# Patient Record
Sex: Male | Born: 1955 | Race: Black or African American | Hispanic: No | Marital: Single | Smoking: Never smoker
Health system: Southern US, Community
[De-identification: ages and names within clinical notes are randomized; demographics above are authoritative.]

## PROBLEM LIST (undated history)

## (undated) DIAGNOSIS — E119 Type 2 diabetes mellitus without complications: Secondary | ICD-10-CM

## (undated) DIAGNOSIS — T7840XA Allergy, unspecified, initial encounter: Secondary | ICD-10-CM

## (undated) DIAGNOSIS — I10 Essential (primary) hypertension: Secondary | ICD-10-CM

## (undated) HISTORY — DX: Type 2 diabetes mellitus without complications: E11.9

## (undated) HISTORY — DX: Essential (primary) hypertension: I10

## (undated) HISTORY — DX: Allergy, unspecified, initial encounter: T78.40XA

---

## 2013-12-07 ENCOUNTER — Encounter: Payer: BC Managed Care – PPO | Attending: Family Medicine

## 2013-12-07 DIAGNOSIS — E119 Type 2 diabetes mellitus without complications: Secondary | ICD-10-CM | POA: Insufficient documentation

## 2013-12-07 DIAGNOSIS — Z713 Dietary counseling and surveillance: Secondary | ICD-10-CM | POA: Insufficient documentation

## 2013-12-14 DIAGNOSIS — E119 Type 2 diabetes mellitus without complications: Secondary | ICD-10-CM

## 2013-12-14 DIAGNOSIS — Z713 Dietary counseling and surveillance: Secondary | ICD-10-CM | POA: Diagnosis not present

## 2013-12-20 NOTE — Progress Notes (Signed)

## 2013-12-21 DIAGNOSIS — E119 Type 2 diabetes mellitus without complications: Secondary | ICD-10-CM

## 2013-12-21 NOTE — Progress Notes (Signed)
Patient was seen on 12/21/13 for the third of a series of three diabetes self-management courses at the [facility]. The following learning objectives were met by the patient during this class:    State the amount of activity recommended for healthy living   Describe activities suitable for individual needs   Identify ways to regularly incorporate activity into daily life   Identify barriers to activity and ways to over come these barriers  Identify diabetes medications being personally used and their primary action for lowering glucose and possible side effects   Describe role of stress on blood glucose and develop strategies to address psychosocial issues   Identify diabetes complications and ways to prevent them  Explain how to manage diabetes during illness   Evaluate success in meeting personal goal   Establish 2-3 goals that they will plan to diligently work on until they return for the  66-monthfollow-up visit  Goals:  Follow Diabetes Meal Plan as instructed  Aim for 15-30 mins of physical activity daily as tolerated  Bring food record and glucose log to your follow up visit  Your patient has established the following 4 month goals in their individualized success plan: I will count my carb choices at most meals and snacks I will increase my activity level at least 2 days a week  Your patient has identified these potential barriers to change:  Weather  Your patient has identified their diabetes self-care support plan as  [facility]  Family member  Plan:  Attend [facility] 4 in 4 months

## 2014-04-25 ENCOUNTER — Ambulatory Visit: Payer: BC Managed Care – PPO

## 2015-02-14 ENCOUNTER — Ambulatory Visit (INDEPENDENT_AMBULATORY_CARE_PROVIDER_SITE_OTHER): Payer: BLUE CROSS/BLUE SHIELD

## 2015-02-14 ENCOUNTER — Ambulatory Visit (INDEPENDENT_AMBULATORY_CARE_PROVIDER_SITE_OTHER): Payer: BLUE CROSS/BLUE SHIELD | Admitting: Family Medicine

## 2015-02-14 VITALS — BP 128/76 | HR 64 | Temp 98.5°F | Resp 16 | Ht 67.0 in | Wt 179.6 lb

## 2015-02-14 DIAGNOSIS — M25551 Pain in right hip: Secondary | ICD-10-CM

## 2015-02-14 DIAGNOSIS — M5441 Lumbago with sciatica, right side: Secondary | ICD-10-CM | POA: Diagnosis not present

## 2015-02-14 DIAGNOSIS — R739 Hyperglycemia, unspecified: Secondary | ICD-10-CM

## 2015-02-14 IMAGING — CR DG LUMBAR SPINE COMPLETE 4+V
5 series · 5 of 5 positions shown · non-contrast
Comparison: None.

CLINICAL DATA: Intermittent low back pain for the past 10 days. No
known injury.

EXAM:
LUMBAR SPINE - COMPLETE 4+ VIEW

[AP]
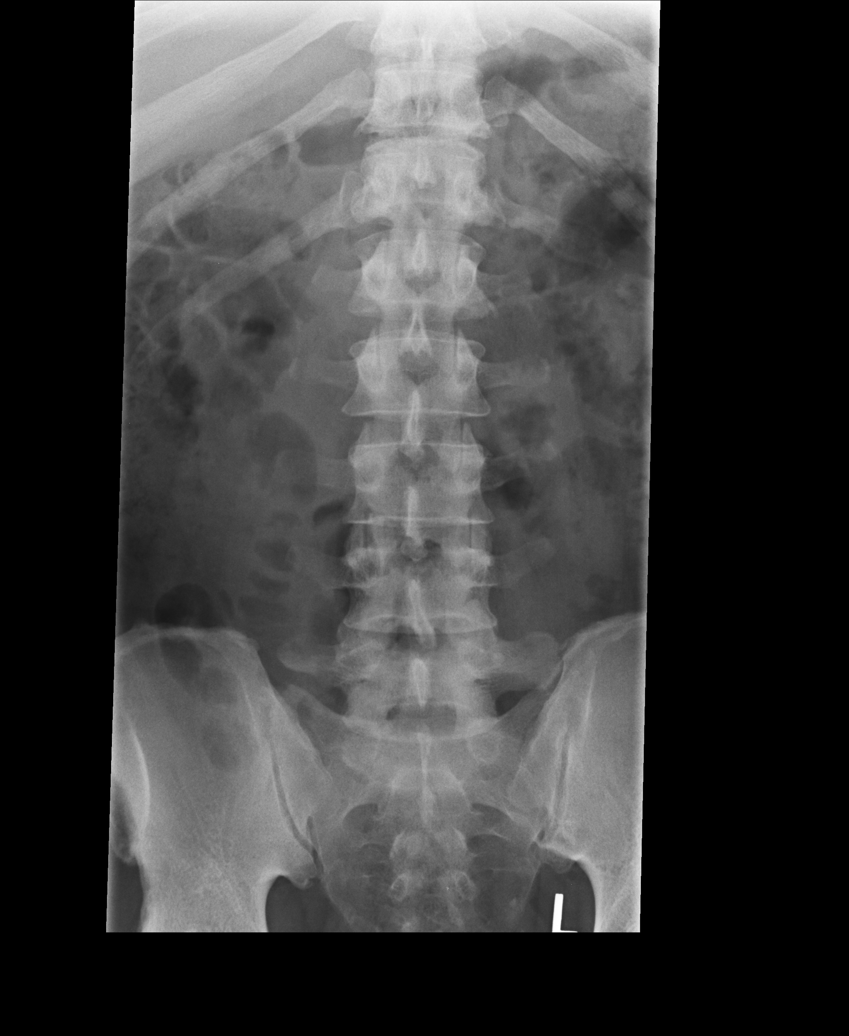

[rpo]
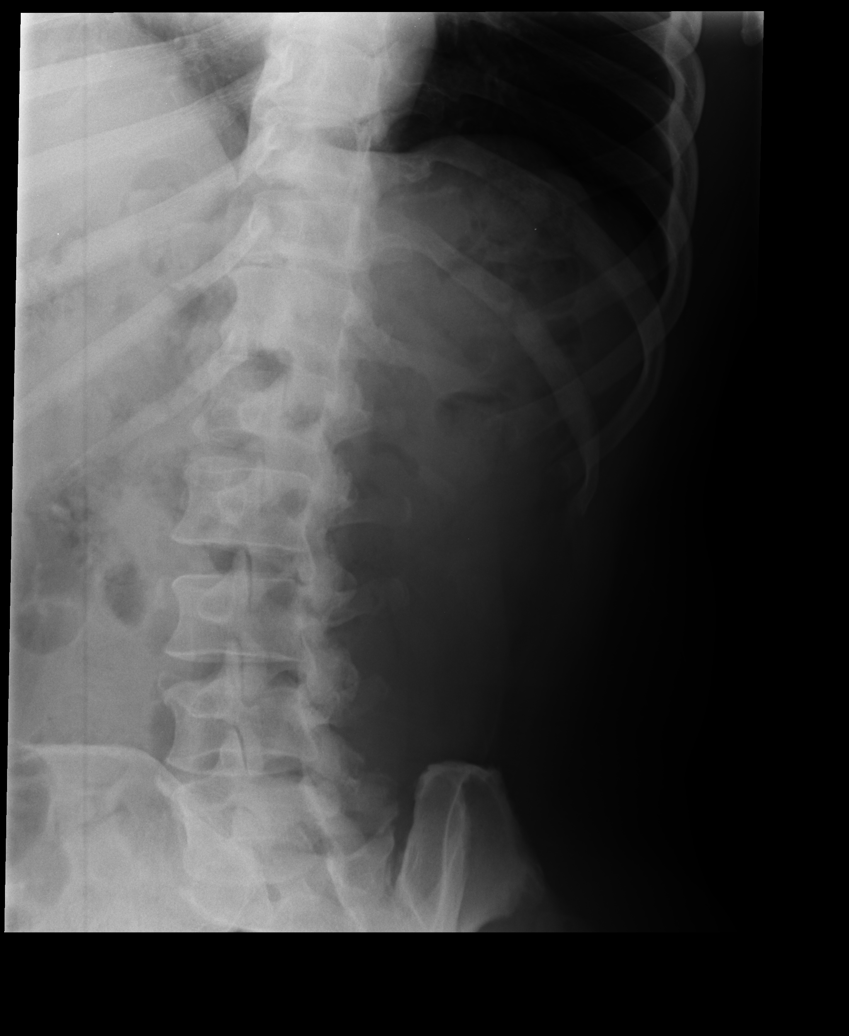

[lpo]
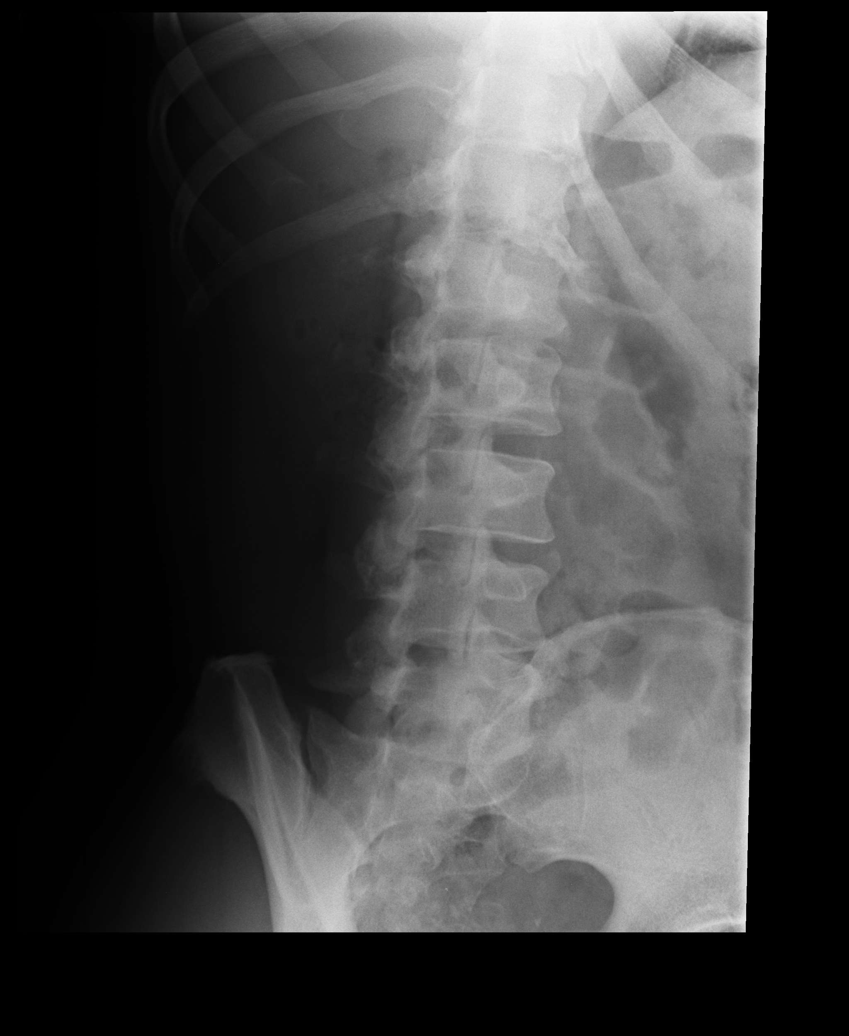

[lateral]
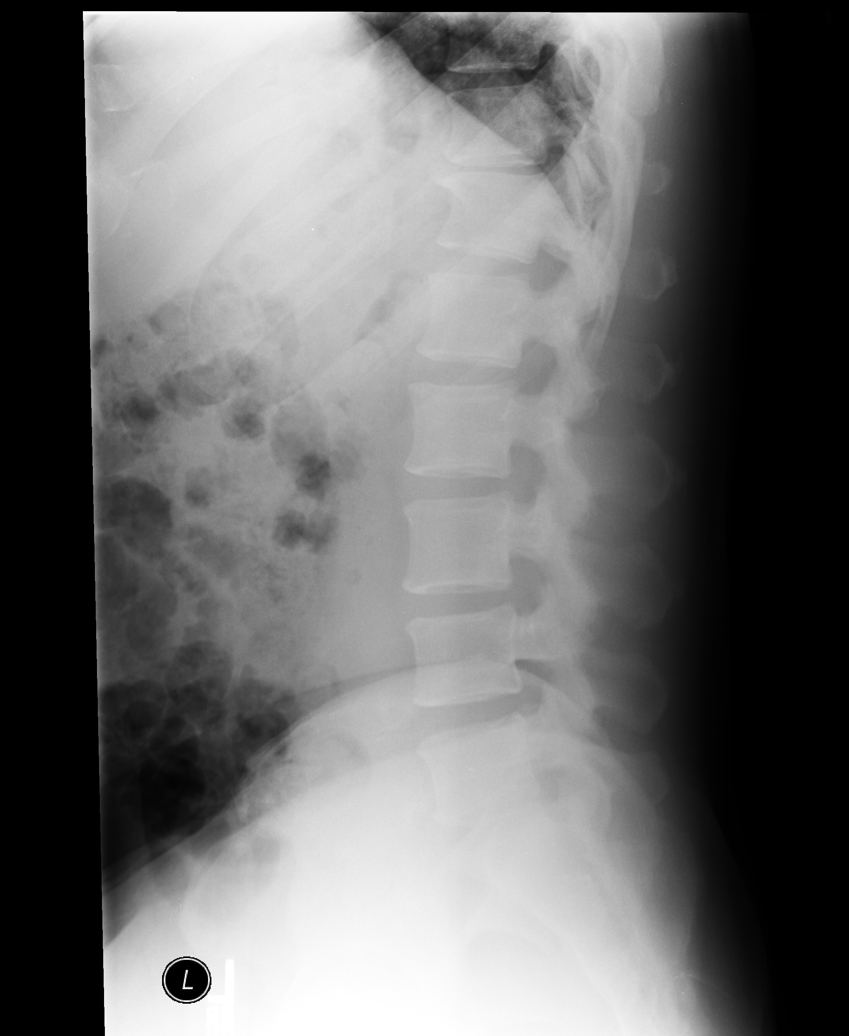

[l5 s1]
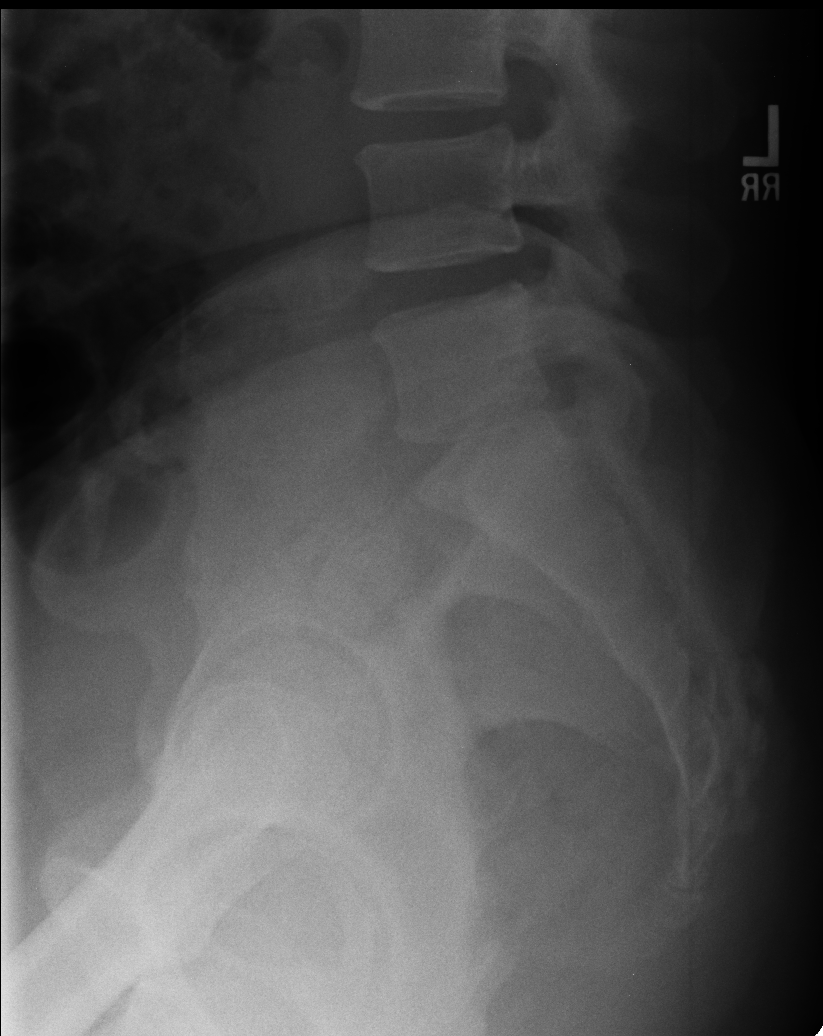

[5 of 5 positions shown; findings below may reference images not displayed]

FINDINGS: Five non-rib-bearing lumbar vertebrae. Mild anterior spur formation
in the lower thoracic spine. Minimal anterior spur formation in the
mid and lower lumbar spine. Mild lateral spur formation in the lower
thoracic and upper lumbar spine. No fractures, pars defects or
subluxations.
IMPRESSION: Mild degenerative changes.

## 2015-02-14 MED ORDER — MELOXICAM 7.5 MG PO TABS: 7.5000 mg | ORAL_TABLET | Freq: Every day | ORAL | Status: AC

## 2015-02-14 MED ORDER — CYCLOBENZAPRINE HCL 5 MG PO TABS: ORAL_TABLET | ORAL | Status: AC

## 2015-02-14 NOTE — Progress Notes (Signed)
Subjective:    Patient ID: Shane Herman, male    DOB: [birth year], 57 y.o.   MRN: [id number]  HPI Shane Herman is a 59 y.o. male  Presents complaining of right hip, leg, and knee pain.  Started about 10 days ago in R hip.  Noticed as sitting in front of TV on the floor. NKI. Front/inside of hip. No prior similar hip issues. Treated with ibuprofen otc tid, heating pad. Notices with bending forward or walking, and with walking has pain into knee past 4 days.  Feels like knee is going to collapse or less control at times. Worse after sitting for awhile and then walking.  Better after up and walking for awhile. Sitting in chair helps. Felt like swelling in hip area this am. No fever. Does have occasional low back pain past 10 days. No known hx of sciatica, but has had back pain few times in past that resolves in few days. No rash.   Older brother passed away 2 years ago with some kind of bone cancer.   No recent prolonged car travel or air travel, no recent calf pain or swelling. No hx of blood clots. No chest pain/dyspnea.   No bowel or bladder incontinence, no saddle anesthesia, no lower extremity weakness.   Hist of borderline blood sugar treated with diet and exercise only.  SH: Ecologist. Nonsmoker. Rare alcohol use, no IDU.   There are no active problems to display for this patient.  Past Medical History  Diagnosis Date  . Allergy   . Diabetes mellitus without complication   . Hypertension    History reviewed. No pertinent past surgical history. No Known Allergies Prior to Admission medications   Medication Sig Start Date End Date Taking? Authorizing Provider  lisinopril (PRINIVIL,ZESTRIL) 20 MG tablet Take 20 mg by mouth daily.   Yes Historical Provider, MD   Social History   Social History  . Marital Status: Single    Spouse Name: N/A  . Number of Children: N/A  . Years of Education: N/A   Occupational History  . Not on file.   Social  History Main Topics  . Smoking status: Never Smoker   . Smokeless tobacco: Never Used  . Alcohol Use: 1.2 oz/week    2 Standard drinks or equivalent per week  . Drug Use: No  . Sexual Activity: Not on file   Other Topics Concern  . Not on file   Social History Narrative  . No narrative on file     Review of Systems  Constitutional: Negative for fever and chills.  Gastrointestinal: Negative for abdominal pain.  Genitourinary: Negative for difficulty urinating.  Musculoskeletal: Positive for myalgias, back pain and gait problem. Negative for joint swelling.  Skin: Negative for color change and rash.  Neurological: Negative for weakness.       No le weakness. No bowel/bladder incontinence, no saddle anesthesia.       Objective:   Physical Exam  Constitutional: He is oriented to person, place, and time. He appears well-developed and well-nourished. No distress.  HENT:  Head: Normocephalic and atraumatic.  Neck: Normal range of motion.  Pulmonary/Chest: Effort normal.  Abdominal: Soft. There is no tenderness.  Musculoskeletal: He exhibits tenderness.       Right hip: He exhibits tenderness. He exhibits normal range of motion, normal strength and no bony tenderness.       Lumbar back: He exhibits tenderness and spasm. He exhibits normal range of motion and no bony  tenderness.       Back:       Right lower leg: He exhibits no tenderness and no swelling.       Left lower leg: He exhibits no tenderness and no swelling.       Legs: Neurological: He is alert and oriented to person, place, and time. He has normal strength. No sensory deficit. He displays no Babinski's sign on the right side. He displays no Babinski's sign on the left side.  Reflex Scores:      Patellar reflexes are 1+ on the right side and 1+ on the left side.      Achilles reflexes are 2+ on the right side and 2+ on the left side. Able to heel and toe walk without difficulty.  Skin: Skin is warm and dry. No rash  noted.  Psychiatric: He has a normal mood and affect. His behavior is normal.   Filed Vitals:   02/14/15 1358  BP: 128/76  Pulse: 64  Temp: 98.5 F (36.9 C)  TempSrc: Oral  Resp: 16  Height: 5\' 7"  (1.702 m)  Weight: 179 lb 9.6 oz (81.466 kg)  SpO2: 98%    [facility] reading (PRIMARY) by  Dr. Neva Seat: LS spine: No acute findings. R hip: No apparent fracture, no acute bony findings noted.      Assessment & Plan:   [name] is a 59 y.o. male Right hip pain - Plan: DG HIP UNILAT W OR W/O PELVIS 2-3 VIEWS RIGHT, meloxicam (MOBIC) 7.5 MG tablet, Right-sided low back pain with right-sided sciatica - Plan: DG Lumbar Spine Complete, cyclobenzaprine (FLEXERIL) 5 MG tablet, meloxicam (MOBIC) 7.5 MG tablet  -Onset approximately 10 days ago admission right hip, but is extending from his right low back into the right thigh and knee. No known injury. Suspicious for possible sciatica on the right side, but strength intact, no red flags on exam or history at this time. He does have a family history of cancer in the bone and his older brother, so would want to closely follow these symptoms to make sure no further imaging is necessary or orthopedic eval.  -Initial trial of meloxicam 7.5 mg daily when necessary. Long-term use discouraged, and cardiac risks discussed. Can also use Flexeril up to every 8 hours, side effects discussed.  -Handout on sciatica and back pain, with home exercises and range of motion discussed. Symptomatic care discussed. Return to clinic in 1 week, sooner if worse.   Hyperglycemia   - diet controlled by history. Continue routine follow-up with primary care provider.   Meds ordered this encounter  Medications  . lisinopril (PRINIVIL,ZESTRIL) 20 MG tablet    Sig: Take 20 mg by mouth daily.  . cyclobenzaprine (FLEXERIL) 5 MG tablet    Sig: 1 pill by mouth up to every 8 hours as needed. Start with one pill by mouth each bedtime as needed due to sedation    Dispense:  15  tablet    Refill:  0  . meloxicam (MOBIC) 7.5 MG tablet    Sig: Take 1 tablet (7.5 mg total) by mouth daily.    Dispense:  30 tablet    Refill:  0   Patient Instructions  X-rays in the office looked okay. We will send these to the radiologist to over read as well. Your symptoms may be coming from your low back, which can sometimes radiate down to the hip and leg with a condition called sciatica. Start meloxicam once per day, Flexeril up to  every 8 hours if needed for muscle spasm (but this medicine does cause sedation so you may want to start it at night first). Heat or ice to affected areas and range of motion as discussed. Recheck in the next 1 week if not improving, or sooner if worse. We may need to have you seen by orthopedics or possible other imaging if your symptoms are not improving. Return to the clinic or go to the nearest emergency room if any of your symptoms worsen or new symptoms occur.   Back Pain, Adult Low back pain is very common. About 1 in 5 people have back pain.The cause of low back pain is rarely dangerous. The pain often gets better over time.About half of people with a sudden onset of back pain feel better in just 2 weeks. About 8 in 10 people feel better by 6 weeks.  CAUSES Some common causes of back pain include:  Strain of the muscles or ligaments supporting the spine.  Wear and tear (degeneration) of the spinal discs.  Arthritis.  Direct injury to the back. DIAGNOSIS Most of the time, the direct cause of low back pain is not known.However, back pain can be treated effectively even when the exact cause of the pain is unknown.Answering your caregiver's questions about your overall health and symptoms is one of the most accurate ways to make sure the cause of your pain is not dangerous. If your caregiver needs more information, he or she may order lab work or imaging tests (X-rays or MRIs).However, even if imaging tests show changes in your back, this usually  does not require surgery. HOME CARE INSTRUCTIONS For many people, back pain returns.Since low back pain is rarely dangerous, it is often a condition that people can learn to [facility] their own.   Remain active. It is stressful on the back to sit or stand in one place. Do not sit, drive, or stand in one place for more than 30 minutes at a time. Take short walks on level surfaces as soon as pain allows.Try to increase the length of time you walk each day.  Do not stay in bed.Resting more than 1 or 2 days can delay your recovery.  Do not avoid exercise or work.Your body is made to move.It is not dangerous to be active, even though your back may hurt.Your back will likely heal faster if you return to being active before your pain is gone.  Pay attention to your body when you bend and lift. Many people have less discomfortwhen lifting if they bend their knees, keep the load close to their bodies,and avoid twisting. Often, the most comfortable positions are those that put less stress on your recovering back.  Find a comfortable position to sleep. Use a firm mattress and lie on your side with your knees slightly bent. If you lie on your back, put a pillow under your knees.  Only take over-the-counter or prescription medicines as directed by your caregiver. Over-the-counter medicines to reduce pain and inflammation are often the most helpful.Your caregiver may prescribe muscle relaxant drugs.These medicines help dull your pain so you can more quickly return to your normal activities and healthy exercise.  Put ice on the injured area.  Put ice in a plastic bag.  Place a towel between your skin and the bag.  Leave the ice on for 15-20 minutes, 03-04 times a day for the first 2 to 3 days. After that, ice and heat may be alternated to reduce pain and spasms.  Ask your caregiver about trying back exercises and gentle massage. This may be of some benefit.  Avoid feeling anxious or  stressed.Stress increases muscle tension and can worsen back pain.It is important to recognize when you are anxious or stressed and learn ways to manage it.Exercise is a great option. SEEK MEDICAL CARE IF:  You have pain that is not relieved with rest or medicine.  You have pain that does not improve in 1 week.  You have new symptoms.  You are generally not feeling well. SEEK IMMEDIATE MEDICAL CARE IF:   You have pain that radiates from your back into your legs.  You develop new bowel or bladder control problems.  You have unusual weakness or numbness in your arms or legs.  You develop nausea or vomiting.  You develop abdominal pain.  You feel faint. Document Released: 06/10/2005 Document Revised: 12/10/2011 Document Reviewed: 10/12/2013 [facility] Patient Information 2015 [place], [place]. This information is not intended to replace advice given to you by your health care provider. Make sure you discuss any questions you have with your health care provider. Sciatica with Rehab The sciatic nerve runs from the back down the leg and is responsible for sensation and control of the muscles in the back (posterior) side of the thigh, lower leg, and foot. Sciatica is a condition that is characterized by inflammation of this nerve.  SYMPTOMS   Signs of nerve damage, including numbness and/or weakness along the posterior side of the lower extremity.  Pain in the back of the thigh that may also travel down the leg.  Pain that worsens when sitting for long periods of time.  Occasionally, pain in the back or buttock. CAUSES  Inflammation of the sciatic nerve is the cause of sciatica. The inflammation is due to something irritating the nerve. Common sources of irritation include:  Sitting for long periods of time.  Direct trauma to the nerve.  Arthritis of the spine.  Herniated or ruptured disk.  Slipping of the vertebrae (spondylolisthesis).  Pressure from soft tissues, such as  muscles or ligament-like tissue (fascia). RISK INCREASES WITH:  Sports that place pressure or stress on the spine (football or weightlifting).  Poor strength and flexibility.  Failure to warm up properly before activity.  Family history of low back pain or disk disorders.  Previous back injury or surgery.  Poor body mechanics, especially when lifting, or poor posture. PREVENTION   Warm up and stretch properly before activity.  Maintain physical fitness:  Strength, flexibility, and endurance.  Cardiovascular fitness.  Learn and use proper technique, especially with posture and lifting. When possible, have coach correct improper technique.  Avoid activities that place stress on the spine. PROGNOSIS If treated properly, then sciatica usually resolves within 6 weeks. However, occasionally surgery is necessary.  RELATED COMPLICATIONS   Permanent nerve damage, including pain, numbness, tingle, or weakness.  Chronic back pain.  Risks of surgery: infection, bleeding, nerve damage, or damage to surrounding tissues. TREATMENT Treatment initially involves resting from any activities that aggravate your symptoms. The use of ice and medication may help reduce pain and inflammation. The use of strengthening and stretching exercises may help reduce pain with activity. These exercises may be performed at home or with referral to a therapist. A therapist may recommend further treatments, such as transcutaneous electronic nerve stimulation (TENS) or ultrasound. Your caregiver may recommend corticosteroid injections to help reduce inflammation of the sciatic nerve. If symptoms persist despite non-surgical (conservative) treatment, then surgery may be recommended. MEDICATION  If pain medication is necessary, then nonsteroidal anti-inflammatory medications, such as aspirin and ibuprofen, or other minor pain relievers, such as acetaminophen, are often recommended.  Do not take pain medication for  7 days before surgery.  Prescription pain relievers may be given if deemed necessary by your caregiver. Use only as directed and only as much as you need.  Ointments applied to the skin may be helpful.  Corticosteroid injections may be given by your caregiver. These injections should be reserved for the most serious cases, because they may only be given a certain number of times. HEAT AND COLD  Cold treatment (icing) relieves pain and reduces inflammation. Cold treatment should be applied for 10 to 15 minutes every 2 to 3 hours for inflammation and pain and immediately after any activity that aggravates your symptoms. Use ice packs or massage the area with a piece of ice (ice massage).  Heat treatment may be used prior to performing the stretching and strengthening activities prescribed by your caregiver, physical therapist, or athletic trainer. Use a heat pack or soak the injury in warm water. SEEK MEDICAL CARE IF:  Treatment seems to offer no benefit, or the condition worsens.  Any medications produce adverse side effects. EXERCISES  RANGE OF MOTION (ROM) AND STRETCHING EXERCISES - Sciatica Most people with sciatic will find that their symptoms worsen with either excessive bending forward (flexion) or arching at the low back (extension). The exercises which will help resolve your symptoms will focus on the opposite motion. Your physician, physical therapist or athletic trainer will help you determine which exercises will be most helpful to resolve your low back pain. Do not complete any exercises without first consulting with your clinician. Discontinue any exercises which worsen your symptoms until you speak to your clinician. If you have pain, numbness or tingling which travels down into your buttocks, leg or foot, the goal of the therapy is for these symptoms to move closer to your back and eventually resolve. Occasionally, these leg symptoms will get better, but your low back pain may  worsen; this is typically an indication of progress in your rehabilitation. Be certain to be very alert to any changes in your symptoms and the activities in which you participated in the 24 hours prior to the change. Sharing this information with your clinician will allow him/her to most efficiently treat your condition. These exercises may help you when beginning to rehabilitate your injury. Your symptoms may resolve with or without further involvement from your physician, physical therapist or athletic trainer. While completing these exercises, remember:   Restoring tissue flexibility helps normal motion to return to the joints. This allows healthier, less painful movement and activity.  An effective stretch should be held for at least 30 seconds.  A stretch should never be painful. You should only feel a gentle lengthening or release in the stretched tissue. FLEXION RANGE OF MOTION AND STRETCHING EXERCISES: STRETCH - Flexion, Single Knee to Chest   Lie on a firm bed or floor with both legs extended in front of you.  Keeping one leg in contact with the floor, bring your opposite knee to your chest. Hold your leg in place by either grabbing behind your thigh or at your knee.  Pull until you feel a gentle stretch in your low back. Hold __________ seconds.  Slowly release your grasp and repeat the exercise with the opposite side. Repeat __________ times. Complete this exercise __________ times per day.  STRETCH - Flexion, Double Knee to Chest  Lie on a firm bed or floor with both legs extended in front of you.  Keeping one leg in contact with the floor, bring your opposite knee to your chest.  Tense your stomach muscles to support your back and then lift your other knee to your chest. Hold your legs in place by either grabbing behind your thighs or at your knees.  Pull both knees toward your chest until you feel a gentle stretch in your low back. Hold __________ seconds.  Tense your  stomach muscles and slowly return one leg at a time to the floor. Repeat __________ times. Complete this exercise __________ times per day.  STRETCH - Low Trunk Rotation   Lie on a firm bed or floor. Keeping your legs in front of you, bend your knees so they are both pointed toward the ceiling and your feet are flat on the floor.  Extend your arms out to the side. This will stabilize your upper body by keeping your shoulders in contact with the floor.  Gently and slowly drop both knees together to one side until you feel a gentle stretch in your low back. Hold for __________ seconds.  Tense your stomach muscles to support your low back as you bring your knees back to the starting position. Repeat the exercise to the other side. Repeat __________ times. Complete this exercise __________ times per day  EXTENSION RANGE OF MOTION AND FLEXIBILITY EXERCISES: STRETCH - Extension, Prone on Elbows  Lie on your stomach on the floor, a bed will be too soft. Place your palms about shoulder width apart and at the height of your head.  Place your elbows under your shoulders. If this is too painful, stack pillows under your chest.  Allow your body to relax so that your hips drop lower and make contact more completely with the floor.  Hold this position for __________ seconds.  Slowly return to lying flat on the floor. Repeat __________ times. Complete this exercise __________ times per day.  RANGE OF MOTION - Extension, Prone Press Ups  Lie on your stomach on the floor, a bed will be too soft. Place your palms about shoulder width apart and at the height of your head.  Keeping your back as relaxed as possible, slowly straighten your elbows while keeping your hips on the floor. You may adjust the placement of your hands to maximize your comfort. As you gain motion, your hands will come more underneath your shoulders.  Hold this position __________ seconds.  Slowly return to lying flat on the  floor. Repeat __________ times. Complete this exercise __________ times per day.  STRENGTHENING EXERCISES - Sciatica  These exercises may help you when beginning to rehabilitate your injury. These exercises should be done near your "sweet spot." This is the neutral, low-back arch, somewhere between fully rounded and fully arched, that is your least painful position. When performed in this safe range of motion, these exercises can be used for people who have either a flexion or extension based injury. These exercises may resolve your symptoms with or without further involvement from your physician, physical therapist or athletic trainer. While completing these exercises, remember:   Muscles can gain both the endurance and the strength needed for everyday activities through controlled exercises.  Complete these exercises as instructed by your physician, physical therapist or athletic trainer. Progress with the resistance and repetition exercises only as your caregiver advises.  You may experience muscle soreness or fatigue, but the pain or discomfort you are  trying to eliminate should never worsen during these exercises. If this pain does worsen, stop and make certain you are following the directions exactly. If the pain is still present after adjustments, discontinue the exercise until you can discuss the trouble with your clinician. STRENGTHENING - Deep Abdominals, Pelvic Tilt   Lie on a firm bed or floor. Keeping your legs in front of you, bend your knees so they are both pointed toward the ceiling and your feet are flat on the floor.  Tense your lower abdominal muscles to press your low back into the floor. This motion will rotate your pelvis so that your tail bone is scooping upwards rather than pointing at your feet or into the floor.  With a gentle tension and even breathing, hold this position for __________ seconds. Repeat __________ times. Complete this exercise __________ times per day.   STRENGTHENING - Abdominals, Crunches   Lie on a firm bed or floor. Keeping your legs in front of you, bend your knees so they are both pointed toward the ceiling and your feet are flat on the floor. Cross your arms over your chest.  Slightly tip your chin down without bending your neck.  Tense your abdominals and slowly lift your trunk high enough to just clear your shoulder blades. Lifting higher can put excessive stress on the low back and does not further strengthen your abdominal muscles.  Control your return to the starting position. Repeat __________ times. Complete this exercise __________ times per day.  STRENGTHENING - Quadruped, Opposite UE/LE Lift  Assume a hands and knees position on a firm surface. Keep your hands under your shoulders and your knees under your hips. You may place padding under your knees for comfort.  Find your neutral spine and gently tense your abdominal muscles so that you can maintain this position. Your shoulders and hips should form a rectangle that is parallel with the floor and is not twisted.  Keeping your trunk steady, lift your right hand no higher than your shoulder and then your left leg no higher than your hip. Make sure you are not holding your breath. Hold this position __________ seconds.  Continuing to keep your abdominal muscles tense and your back steady, slowly return to your starting position. Repeat with the opposite arm and leg. Repeat __________ times. Complete this exercise __________ times per day.  STRENGTHENING - Abdominals and Quadriceps, Straight Leg Raise   Lie on a firm bed or floor with both legs extended in front of you.  Keeping one leg in contact with the floor, bend the other knee so that your foot can rest flat on the floor.  Find your neutral spine, and tense your abdominal muscles to maintain your spinal position throughout the exercise.  Slowly lift your straight leg off the floor about 6 inches for a count of 15,  making sure to not hold your breath.  Still keeping your neutral spine, slowly lower your leg all the way to the floor. Repeat this exercise with each leg __________ times. Complete this exercise __________ times per day. POSTURE AND BODY MECHANICS CONSIDERATIONS - Sciatica Keeping correct posture when sitting, standing or completing your activities will reduce the stress put on different body tissues, allowing injured tissues a chance to heal and limiting painful experiences. The following are general guidelines for improved posture. Your physician or physical therapist will provide you with any instructions specific to your needs. While reading these guidelines, remember:  The exercises prescribed by your provider will  help you have the flexibility and strength to maintain correct postures.  The correct posture provides the optimal environment for your joints to work. All of your joints have less wear and tear when properly supported by a spine with good posture. This means you will experience a healthier, less painful body.  Correct posture must be practiced with all of your activities, especially prolonged sitting and standing. Correct posture is as important when doing repetitive low-stress activities (typing) as it is when doing a single heavy-load activity (lifting). RESTING POSITIONS Consider which positions are most painful for you when choosing a resting position. If you have pain with flexion-based activities (sitting, bending, stooping, squatting), choose a position that allows you to rest in a less flexed posture. You would want to avoid curling into a fetal position on your side. If your pain worsens with extension-based activities (prolonged standing, working overhead), avoid resting in an extended position such as sleeping on your stomach. Most people will find more comfort when they rest with their spine in a more neutral position, neither too rounded nor too arched. Lying on a  non-sagging bed on your side with a pillow between your knees, or on your back with a pillow under your knees will often provide some relief. Keep in mind, being in any one position for a prolonged period of time, no matter how correct your posture, can still lead to stiffness. PROPER SITTING POSTURE In order to minimize stress and discomfort on your spine, you must sit with correct posture Sitting with good posture should be effortless for a healthy body. Returning to good posture is a gradual process. Many people can work toward this most comfortably by using various supports until they have the flexibility and strength to maintain this posture on their own. When sitting with proper posture, your ears will fall over your shoulders and your shoulders will fall over your hips. You should use the back of the chair to support your upper back. Your low back will be in a neutral position, just slightly arched. You may place a small pillow or folded towel at the base of your low back for support.  When working at a desk, create an environment that supports good, upright posture. Without extra support, muscles fatigue and lead to excessive strain on joints and other tissues. Keep these recommendations in mind: CHAIR:   A chair should be able to slide under your desk when your back makes contact with the back of the chair. This allows you to work closely.  The chair's height should allow your eyes to be level with the upper part of your monitor and your hands to be slightly lower than your elbows. BODY POSITION  Your feet should make contact with the floor. If this is not possible, use a foot rest.  Keep your ears over your shoulders. This will reduce stress on your neck and low back. INCORRECT SITTING POSTURES   If you are feeling tired and unable to assume a healthy sitting posture, do not slouch or slump. This puts excessive strain on your back tissues, causing more damage and pain. Healthier options  include:  Using more support, like a lumbar pillow.  Switching tasks to something that requires you to be upright or walking.  Talking a brief walk.  Lying down to rest in a neutral-spine position. PROLONGED STANDING WHILE SLIGHTLY LEANING FORWARD  When completing a task that requires you to lean forward while standing in one place for a long time, place  either foot up on a stationary 2-4 inch high object to help maintain the best posture. When both feet are on the ground, the low back tends to lose its slight inward curve. If this curve flattens (or becomes too large), then the back and your other joints will experience too much stress, fatigue more quickly and can cause pain.  CORRECT STANDING POSTURES Proper standing posture should be assumed with all daily activities, even if they only take a few moments, like when brushing your teeth. As in sitting, your ears should fall over your shoulders and your shoulders should fall over your hips. You should keep a slight tension in your abdominal muscles to brace your spine. Your tailbone should point down to the ground, not behind your body, resulting in an over-extended swayback posture.  INCORRECT STANDING POSTURES  Common incorrect standing postures include a forward head, locked knees and/or an excessive swayback. WALKING Walk with an upright posture. Your ears, shoulders and hips should all line-up. PROLONGED ACTIVITY IN A FLEXED POSITION When completing a task that requires you to bend forward at your waist or lean over a low surface, try to find a way to stabilize 3 of 4 of your limbs. You can place a hand or elbow on your thigh or rest a knee on the surface you are reaching across. This will provide you more stability so that your muscles do not fatigue as quickly. By keeping your knees relaxed, or slightly bent, you will also reduce stress across your low back. CORRECT LIFTING TECHNIQUES DO :   Assume a wide stance. This will provide you  more stability and the opportunity to get as close as possible to the object which you are lifting.  Tense your abdominals to brace your spine; then bend at the knees and hips. Keeping your back locked in a neutral-spine position, lift using your leg muscles. Lift with your legs, keeping your back straight.  Test the weight of unknown objects before attempting to lift them.  Try to keep your elbows locked down at your sides in order get the best strength from your shoulders when carrying an object.  Always ask for help when lifting heavy or awkward objects. INCORRECT LIFTING TECHNIQUES DO NOT:   Lock your knees when lifting, even if it is a small object.  Bend and twist. Pivot at your feet or move your feet when needing to change directions.  Assume that you cannot safely pick up a paperclip without proper posture. Document Released: 06/10/2005 Document Revised: 10/25/2013 Document Reviewed: 09/22/2008 [facility] Patient Information 2015 [place], [place]. This information is not intended to replace advice given to you by your health care provider. Make sure you discuss any questions you have with your health care provider.

## 2015-02-14 NOTE — Patient Instructions (Signed)
X-rays in the office looked okay. We will send these to the radiologist to over read as well. Your symptoms may be coming from your low back, which can sometimes radiate down to the hip and leg with a condition called sciatica. Start meloxicam once per day, Flexeril up to every 8 hours if needed for muscle spasm (but this medicine does cause sedation so you may want to start it at night first). Heat or ice to affected areas and range of motion as discussed. Recheck in the next 1 week if not improving, or sooner if worse. We may need to have you seen by orthopedics or possible other imaging if your symptoms are not improving. Return to the clinic or go to the nearest emergency room if any of your symptoms worsen or new symptoms occur.   Back Pain, Adult Low back pain is very common. About 1 in 5 people have back pain.The cause of low back pain is rarely dangerous. The pain often gets better over time.About half of people with a sudden onset of back pain feel better in just 2 weeks. About 8 in 10 people feel better by 6 weeks.  CAUSES Some common causes of back pain include:  Strain of the muscles or ligaments supporting the spine.  Wear and tear (degeneration) of the spinal discs.  Arthritis.  Direct injury to the back. DIAGNOSIS Most of the time, the direct cause of low back pain is not known.However, back pain can be treated effectively even when the exact cause of the pain is unknown.Answering your caregiver's questions about your overall health and symptoms is one of the most accurate ways to make sure the cause of your pain is not dangerous. If your caregiver needs more information, he or she may order lab work or imaging tests (X-rays or MRIs).However, even if imaging tests show changes in your back, this usually does not require surgery. HOME CARE INSTRUCTIONS For many people, back pain returns.Since low back pain is rarely dangerous, it is often a condition that people can learn to  [facility] their own.   Remain active. It is stressful on the back to sit or stand in one place. Do not sit, drive, or stand in one place for more than 30 minutes at a time. Take short walks on level surfaces as soon as pain allows.Try to increase the length of time you walk each day.  Do not stay in bed.Resting more than 1 or 2 days can delay your recovery.  Do not avoid exercise or work.Your body is made to move.It is not dangerous to be active, even though your back may hurt.Your back will likely heal faster if you return to being active before your pain is gone.  Pay attention to your body when you bend and lift. Many people have less discomfortwhen lifting if they bend their knees, keep the load close to their bodies,and avoid twisting. Often, the most comfortable positions are those that put less stress on your recovering back.  Find a comfortable position to sleep. Use a firm mattress and lie on your side with your knees slightly bent. If you lie on your back, put a pillow under your knees.  Only take over-the-counter or prescription medicines as directed by your caregiver. Over-the-counter medicines to reduce pain and inflammation are often the most helpful.Your caregiver may prescribe muscle relaxant drugs.These medicines help dull your pain so you can more quickly return to your normal activities and healthy exercise.  Put ice on the injured  area.  Put ice in a plastic bag.  Place a towel between your skin and the bag.  Leave the ice on for 15-20 minutes, 03-04 times a day for the first 2 to 3 days. After that, ice and heat may be alternated to reduce pain and spasms.  Ask your caregiver about trying back exercises and gentle massage. This may be of some benefit.  Avoid feeling anxious or stressed.Stress increases muscle tension and can worsen back pain.It is important to recognize when you are anxious or stressed and learn ways to manage it.Exercise is a great  option. SEEK MEDICAL CARE IF:  You have pain that is not relieved with rest or medicine.  You have pain that does not improve in 1 week.  You have new symptoms.  You are generally not feeling well. SEEK IMMEDIATE MEDICAL CARE IF:   You have pain that radiates from your back into your legs.  You develop new bowel or bladder control problems.  You have unusual weakness or numbness in your arms or legs.  You develop nausea or vomiting.  You develop abdominal pain.  You feel faint. Document Released: 06/10/2005 Document Revised: 12/10/2011 Document Reviewed: 10/12/2013 [facility] Patient Information 2015 [place], [place]. This information is not intended to replace advice given to you by your health care provider. Make sure you discuss any questions you have with your health care provider. Sciatica with Rehab The sciatic nerve runs from the back down the leg and is responsible for sensation and control of the muscles in the back (posterior) side of the thigh, lower leg, and foot. Sciatica is a condition that is characterized by inflammation of this nerve.  SYMPTOMS   Signs of nerve damage, including numbness and/or weakness along the posterior side of the lower extremity.  Pain in the back of the thigh that may also travel down the leg.  Pain that worsens when sitting for long periods of time.  Occasionally, pain in the back or buttock. CAUSES  Inflammation of the sciatic nerve is the cause of sciatica. The inflammation is due to something irritating the nerve. Common sources of irritation include:  Sitting for long periods of time.  Direct trauma to the nerve.  Arthritis of the spine.  Herniated or ruptured disk.  Slipping of the vertebrae (spondylolisthesis).  Pressure from soft tissues, such as muscles or ligament-like tissue (fascia). RISK INCREASES WITH:  Sports that place pressure or stress on the spine (football or weightlifting).  Poor strength and  flexibility.  Failure to warm up properly before activity.  Family history of low back pain or disk disorders.  Previous back injury or surgery.  Poor body mechanics, especially when lifting, or poor posture. PREVENTION   Warm up and stretch properly before activity.  Maintain physical fitness:  Strength, flexibility, and endurance.  Cardiovascular fitness.  Learn and use proper technique, especially with posture and lifting. When possible, have coach correct improper technique.  Avoid activities that place stress on the spine. PROGNOSIS If treated properly, then sciatica usually resolves within 6 weeks. However, occasionally surgery is necessary.  RELATED COMPLICATIONS   Permanent nerve damage, including pain, numbness, tingle, or weakness.  Chronic back pain.  Risks of surgery: infection, bleeding, nerve damage, or damage to surrounding tissues. TREATMENT Treatment initially involves resting from any activities that aggravate your symptoms. The use of ice and medication may help reduce pain and inflammation. The use of strengthening and stretching exercises may help reduce pain with activity. These exercises may be  performed at home or with referral to a therapist. A therapist may recommend further treatments, such as transcutaneous electronic nerve stimulation (TENS) or ultrasound. Your caregiver may recommend corticosteroid injections to help reduce inflammation of the sciatic nerve. If symptoms persist despite non-surgical (conservative) treatment, then surgery may be recommended. MEDICATION  If pain medication is necessary, then nonsteroidal anti-inflammatory medications, such as aspirin and ibuprofen, or other minor pain relievers, such as acetaminophen, are often recommended.  Do not take pain medication for 7 days before surgery.  Prescription pain relievers may be given if deemed necessary by your caregiver. Use only as directed and only as much as you  need.  Ointments applied to the skin may be helpful.  Corticosteroid injections may be given by your caregiver. These injections should be reserved for the most serious cases, because they may only be given a certain number of times. HEAT AND COLD  Cold treatment (icing) relieves pain and reduces inflammation. Cold treatment should be applied for 10 to 15 minutes every 2 to 3 hours for inflammation and pain and immediately after any activity that aggravates your symptoms. Use ice packs or massage the area with a piece of ice (ice massage).  Heat treatment may be used prior to performing the stretching and strengthening activities prescribed by your caregiver, physical therapist, or athletic trainer. Use a heat pack or soak the injury in warm water. SEEK MEDICAL CARE IF:  Treatment seems to offer no benefit, or the condition worsens.  Any medications produce adverse side effects. EXERCISES  RANGE OF MOTION (ROM) AND STRETCHING EXERCISES - Sciatica Most people with sciatic will find that their symptoms worsen with either excessive bending forward (flexion) or arching at the low back (extension). The exercises which will help resolve your symptoms will focus on the opposite motion. Your physician, physical therapist or athletic trainer will help you determine which exercises will be most helpful to resolve your low back pain. Do not complete any exercises without first consulting with your clinician. Discontinue any exercises which worsen your symptoms until you speak to your clinician. If you have pain, numbness or tingling which travels down into your buttocks, leg or foot, the goal of the therapy is for these symptoms to move closer to your back and eventually resolve. Occasionally, these leg symptoms will get better, but your low back pain may worsen; this is typically an indication of progress in your rehabilitation. Be certain to be very alert to any changes in your symptoms and the activities in  which you participated in the 24 hours prior to the change. Sharing this information with your clinician will allow him/her to most efficiently treat your condition. These exercises may help you when beginning to rehabilitate your injury. Your symptoms may resolve with or without further involvement from your physician, physical therapist or athletic trainer. While completing these exercises, remember:   Restoring tissue flexibility helps normal motion to return to the joints. This allows healthier, less painful movement and activity.  An effective stretch should be held for at least 30 seconds.  A stretch should never be painful. You should only feel a gentle lengthening or release in the stretched tissue. FLEXION RANGE OF MOTION AND STRETCHING EXERCISES: STRETCH - Flexion, Single Knee to Chest   Lie on a firm bed or floor with both legs extended in front of you.  Keeping one leg in contact with the floor, bring your opposite knee to your chest. Hold your leg in place by either grabbing behind  your thigh or at your knee.  Pull until you feel a gentle stretch in your low back. Hold __________ seconds.  Slowly release your grasp and repeat the exercise with the opposite side. Repeat __________ times. Complete this exercise __________ times per day.  STRETCH - Flexion, Double Knee to Chest  Lie on a firm bed or floor with both legs extended in front of you.  Keeping one leg in contact with the floor, bring your opposite knee to your chest.  Tense your stomach muscles to support your back and then lift your other knee to your chest. Hold your legs in place by either grabbing behind your thighs or at your knees.  Pull both knees toward your chest until you feel a gentle stretch in your low back. Hold __________ seconds.  Tense your stomach muscles and slowly return one leg at a time to the floor. Repeat __________ times. Complete this exercise __________ times per day.  STRETCH - Low Trunk  Rotation   Lie on a firm bed or floor. Keeping your legs in front of you, bend your knees so they are both pointed toward the ceiling and your feet are flat on the floor.  Extend your arms out to the side. This will stabilize your upper body by keeping your shoulders in contact with the floor.  Gently and slowly drop both knees together to one side until you feel a gentle stretch in your low back. Hold for __________ seconds.  Tense your stomach muscles to support your low back as you bring your knees back to the starting position. Repeat the exercise to the other side. Repeat __________ times. Complete this exercise __________ times per day  EXTENSION RANGE OF MOTION AND FLEXIBILITY EXERCISES: STRETCH - Extension, Prone on Elbows  Lie on your stomach on the floor, a bed will be too soft. Place your palms about shoulder width apart and at the height of your head.  Place your elbows under your shoulders. If this is too painful, stack pillows under your chest.  Allow your body to relax so that your hips drop lower and make contact more completely with the floor.  Hold this position for __________ seconds.  Slowly return to lying flat on the floor. Repeat __________ times. Complete this exercise __________ times per day.  RANGE OF MOTION - Extension, Prone Press Ups  Lie on your stomach on the floor, a bed will be too soft. Place your palms about shoulder width apart and at the height of your head.  Keeping your back as relaxed as possible, slowly straighten your elbows while keeping your hips on the floor. You may adjust the placement of your hands to maximize your comfort. As you gain motion, your hands will come more underneath your shoulders.  Hold this position __________ seconds.  Slowly return to lying flat on the floor. Repeat __________ times. Complete this exercise __________ times per day.  STRENGTHENING EXERCISES - Sciatica  These exercises may help you when beginning to  rehabilitate your injury. These exercises should be done near your "sweet spot." This is the neutral, low-back arch, somewhere between fully rounded and fully arched, that is your least painful position. When performed in this safe range of motion, these exercises can be used for people who have either a flexion or extension based injury. These exercises may resolve your symptoms with or without further involvement from your physician, physical therapist or athletic trainer. While completing these exercises, remember:   Muscles can gain both  the endurance and the strength needed for everyday activities through controlled exercises.  Complete these exercises as instructed by your physician, physical therapist or athletic trainer. Progress with the resistance and repetition exercises only as your caregiver advises.  You may experience muscle soreness or fatigue, but the pain or discomfort you are trying to eliminate should never worsen during these exercises. If this pain does worsen, stop and make certain you are following the directions exactly. If the pain is still present after adjustments, discontinue the exercise until you can discuss the trouble with your clinician. STRENGTHENING - Deep Abdominals, Pelvic Tilt   Lie on a firm bed or floor. Keeping your legs in front of you, bend your knees so they are both pointed toward the ceiling and your feet are flat on the floor.  Tense your lower abdominal muscles to press your low back into the floor. This motion will rotate your pelvis so that your tail bone is scooping upwards rather than pointing at your feet or into the floor.  With a gentle tension and even breathing, hold this position for __________ seconds. Repeat __________ times. Complete this exercise __________ times per day.  STRENGTHENING - Abdominals, Crunches   Lie on a firm bed or floor. Keeping your legs in front of you, bend your knees so they are both pointed toward the ceiling and  your feet are flat on the floor. Cross your arms over your chest.  Slightly tip your chin down without bending your neck.  Tense your abdominals and slowly lift your trunk high enough to just clear your shoulder blades. Lifting higher can put excessive stress on the low back and does not further strengthen your abdominal muscles.  Control your return to the starting position. Repeat __________ times. Complete this exercise __________ times per day.  STRENGTHENING - Quadruped, Opposite UE/LE Lift  Assume a hands and knees position on a firm surface. Keep your hands under your shoulders and your knees under your hips. You may place padding under your knees for comfort.  Find your neutral spine and gently tense your abdominal muscles so that you can maintain this position. Your shoulders and hips should form a rectangle that is parallel with the floor and is not twisted.  Keeping your trunk steady, lift your right hand no higher than your shoulder and then your left leg no higher than your hip. Make sure you are not holding your breath. Hold this position __________ seconds.  Continuing to keep your abdominal muscles tense and your back steady, slowly return to your starting position. Repeat with the opposite arm and leg. Repeat __________ times. Complete this exercise __________ times per day.  STRENGTHENING - Abdominals and Quadriceps, Straight Leg Raise   Lie on a firm bed or floor with both legs extended in front of you.  Keeping one leg in contact with the floor, bend the other knee so that your foot can rest flat on the floor.  Find your neutral spine, and tense your abdominal muscles to maintain your spinal position throughout the exercise.  Slowly lift your straight leg off the floor about 6 inches for a count of 15, making sure to not hold your breath.  Still keeping your neutral spine, slowly lower your leg all the way to the floor. Repeat this exercise with each leg __________  times. Complete this exercise __________ times per day. POSTURE AND BODY MECHANICS CONSIDERATIONS - Sciatica Keeping correct posture when sitting, standing or completing your activities will reduce the  stress put on different body tissues, allowing injured tissues a chance to heal and limiting painful experiences. The following are general guidelines for improved posture. Your physician or physical therapist will provide you with any instructions specific to your needs. While reading these guidelines, remember:  The exercises prescribed by your provider will help you have the flexibility and strength to maintain correct postures.  The correct posture provides the optimal environment for your joints to work. All of your joints have less wear and tear when properly supported by a spine with good posture. This means you will experience a healthier, less painful body.  Correct posture must be practiced with all of your activities, especially prolonged sitting and standing. Correct posture is as important when doing repetitive low-stress activities (typing) as it is when doing a single heavy-load activity (lifting). RESTING POSITIONS Consider which positions are most painful for you when choosing a resting position. If you have pain with flexion-based activities (sitting, bending, stooping, squatting), choose a position that allows you to rest in a less flexed posture. You would want to avoid curling into a fetal position on your side. If your pain worsens with extension-based activities (prolonged standing, working overhead), avoid resting in an extended position such as sleeping on your stomach. Most people will find more comfort when they rest with their spine in a more neutral position, neither too rounded nor too arched. Lying on a non-sagging bed on your side with a pillow between your knees, or on your back with a pillow under your knees will often provide some relief. Keep in mind, being in any one  position for a prolonged period of time, no matter how correct your posture, can still lead to stiffness. PROPER SITTING POSTURE In order to minimize stress and discomfort on your spine, you must sit with correct posture Sitting with good posture should be effortless for a healthy body. Returning to good posture is a gradual process. Many people can work toward this most comfortably by using various supports until they have the flexibility and strength to maintain this posture on their own. When sitting with proper posture, your ears will fall over your shoulders and your shoulders will fall over your hips. You should use the back of the chair to support your upper back. Your low back will be in a neutral position, just slightly arched. You may place a small pillow or folded towel at the base of your low back for support.  When working at a desk, create an environment that supports good, upright posture. Without extra support, muscles fatigue and lead to excessive strain on joints and other tissues. Keep these recommendations in mind: CHAIR:   A chair should be able to slide under your desk when your back makes contact with the back of the chair. This allows you to work closely.  The chair's height should allow your eyes to be level with the upper part of your monitor and your hands to be slightly lower than your elbows. BODY POSITION  Your feet should make contact with the floor. If this is not possible, use a foot rest.  Keep your ears over your shoulders. This will reduce stress on your neck and low back. INCORRECT SITTING POSTURES   If you are feeling tired and unable to assume a healthy sitting posture, do not slouch or slump. This puts excessive strain on your back tissues, causing more damage and pain. Healthier options include:  Using more support, like a lumbar pillow.  Switching  tasks to something that requires you to be upright or walking.  Talking a brief walk.  Lying down to  rest in a neutral-spine position. PROLONGED STANDING WHILE SLIGHTLY LEANING FORWARD  When completing a task that requires you to lean forward while standing in one place for a long time, place either foot up on a stationary 2-4 inch high object to help maintain the best posture. When both feet are on the ground, the low back tends to lose its slight inward curve. If this curve flattens (or becomes too large), then the back and your other joints will experience too much stress, fatigue more quickly and can cause pain.  CORRECT STANDING POSTURES Proper standing posture should be assumed with all daily activities, even if they only take a few moments, like when brushing your teeth. As in sitting, your ears should fall over your shoulders and your shoulders should fall over your hips. You should keep a slight tension in your abdominal muscles to brace your spine. Your tailbone should point down to the ground, not behind your body, resulting in an over-extended swayback posture.  INCORRECT STANDING POSTURES  Common incorrect standing postures include a forward head, locked knees and/or an excessive swayback. WALKING Walk with an upright posture. Your ears, shoulders and hips should all line-up. PROLONGED ACTIVITY IN A FLEXED POSITION When completing a task that requires you to bend forward at your waist or lean over a low surface, try to find a way to stabilize 3 of 4 of your limbs. You can place a hand or elbow on your thigh or rest a knee on the surface you are reaching across. This will provide you more stability so that your muscles do not fatigue as quickly. By keeping your knees relaxed, or slightly bent, you will also reduce stress across your low back. CORRECT LIFTING TECHNIQUES DO :   Assume a wide stance. This will provide you more stability and the opportunity to get as close as possible to the object which you are lifting.  Tense your abdominals to brace your spine; then bend at the knees and  hips. Keeping your back locked in a neutral-spine position, lift using your leg muscles. Lift with your legs, keeping your back straight.  Test the weight of unknown objects before attempting to lift them.  Try to keep your elbows locked down at your sides in order get the best strength from your shoulders when carrying an object.  Always ask for help when lifting heavy or awkward objects. INCORRECT LIFTING TECHNIQUES DO NOT:   Lock your knees when lifting, even if it is a small object.  Bend and twist. Pivot at your feet or move your feet when needing to change directions.  Assume that you cannot safely pick up a paperclip without proper posture. Document Released: 06/10/2005 Document Revised: 10/25/2013 Document Reviewed: 09/22/2008 [facility] Patient Information 2015 [place], [place]. This information is not intended to replace advice given to you by your health care provider. Make sure you discuss any questions you have with your health care provider.

## 2015-09-25 ENCOUNTER — Ambulatory Visit: Payer: Self-pay

## 2015-09-25 ENCOUNTER — Other Ambulatory Visit: Payer: Self-pay | Admitting: Occupational Medicine

## 2015-09-25 DIAGNOSIS — M25562 Pain in left knee: Secondary | ICD-10-CM

## 2015-10-20 DIAGNOSIS — E119 Type 2 diabetes mellitus without complications: Secondary | ICD-10-CM | POA: Diagnosis not present

## 2015-10-20 DIAGNOSIS — Z23 Encounter for immunization: Secondary | ICD-10-CM | POA: Diagnosis not present

## 2015-10-20 DIAGNOSIS — E782 Mixed hyperlipidemia: Secondary | ICD-10-CM | POA: Diagnosis not present

## 2015-10-20 DIAGNOSIS — I10 Essential (primary) hypertension: Secondary | ICD-10-CM | POA: Diagnosis not present

## 2015-10-20 DIAGNOSIS — Z125 Encounter for screening for malignant neoplasm of prostate: Secondary | ICD-10-CM | POA: Diagnosis not present

## 2016-01-12 DIAGNOSIS — Z01818 Encounter for other preprocedural examination: Secondary | ICD-10-CM | POA: Diagnosis not present

## 2016-01-12 DIAGNOSIS — R9431 Abnormal electrocardiogram [ECG] [EKG]: Secondary | ICD-10-CM | POA: Diagnosis not present

## 2016-01-12 DIAGNOSIS — E119 Type 2 diabetes mellitus without complications: Secondary | ICD-10-CM | POA: Diagnosis not present

## 2016-01-12 DIAGNOSIS — S83242A Other tear of medial meniscus, current injury, left knee, initial encounter: Secondary | ICD-10-CM | POA: Diagnosis not present

## 2016-01-16 DIAGNOSIS — Z01810 Encounter for preprocedural cardiovascular examination: Secondary | ICD-10-CM | POA: Diagnosis not present

## 2016-01-16 DIAGNOSIS — I10 Essential (primary) hypertension: Secondary | ICD-10-CM | POA: Diagnosis not present

## 2016-01-16 DIAGNOSIS — E119 Type 2 diabetes mellitus without complications: Secondary | ICD-10-CM | POA: Diagnosis not present

## 2016-01-16 DIAGNOSIS — R9431 Abnormal electrocardiogram [ECG] [EKG]: Secondary | ICD-10-CM | POA: Diagnosis not present

## 2016-01-17 ENCOUNTER — Encounter: Payer: Self-pay | Admitting: Cardiology

## 2016-01-17 ENCOUNTER — Other Ambulatory Visit: Payer: Self-pay | Admitting: Cardiology

## 2016-01-17 DIAGNOSIS — Z01810 Encounter for preprocedural cardiovascular examination: Secondary | ICD-10-CM

## 2016-01-17 DIAGNOSIS — R9431 Abnormal electrocardiogram [ECG] [EKG]: Secondary | ICD-10-CM

## 2016-01-17 DIAGNOSIS — E785 Hyperlipidemia, unspecified: Secondary | ICD-10-CM | POA: Insufficient documentation

## 2016-01-17 DIAGNOSIS — I10 Essential (primary) hypertension: Secondary | ICD-10-CM

## 2016-01-17 NOTE — Consult Note (Signed)
Shane Herman    Date of visit:  01/16/2016 DOB:  [birth year]    Age:  59 yrs. Medical record number:  [id number]     Account number:  [id number] Primary Care Provider: [facility], [facility] ____________________________ CURRENT DIAGNOSES  1. Abnormal electrocardiogram [ECG]  2. Encounter for preprocedural cardiovascular examination  3. Essential hypertension  4. Type 2 diabetes mellitus without complications  5. Hyperlipidemia ____________________________ ALLERGIES  No Known Allergies ____________________________ MEDICATIONS  1. loratadine 10 mg tablet, 1 p.o. daily PRN  2. lisinopril 10 mg tablet, 1 p.o. daily  3. atorvastatin 10 mg tablet, 2 p.o. daily  4. Flonase Allergy Relief 50 mcg/actuation nasal spray,suspension, 1 spray per nostril PRN ____________________________ HISTORY OF PRESENT ILLNESS This very nice 60 year old black male is seen for evaluation of an abnormal EKG in the setting of preoperative evaluation. The patient recently had a meniscal tear and is in need of knee surgery. A preoperative EKG was severely abnormal showing a previous anterior infarction and marked anterolateral T wave abnormalities and the surgery was canceled. The patient has a prior history of hypertension. He is only on a low dose of medicine and really does not check his blood pressure on a regular basis. He also has hyperlipidemia and has been told he has prediabetes. He does not have chest pain or shortness of breath. He has been inactive because of his meniscal tear and does not get much in the way of regular exercise. He denies PND, orthopnea, syncope, palpitations, or claudication. There is no history of a previous EKG ever in his lifetime. He cannot remember an event that would've been a previous myocardial infarction. There is no family history of premature cardiac disease. He says his blood pressure normally runs in the 140s. ____________________________ PAST HISTORY  Past Medical Illnesses:   hypertension, hyperlipidemia, allergic rhiniits, colon polyps;  Cardiovascular Illnesses:  no previous history of cardiac disease;  Infectious Diseases:  no previous history of significant infectious diseases;  Surgical Procedures:  arthroscopic lt knee surgery;  Trauma History:  no previous history of significant trauma;  NYHA Classification:  I;  Canadian Angina Classification:  Class 0: Asymptomatic;  Cardiology Procedures-Invasive:  no previous interventional or invasive cardiology procedures;  Cardiology Procedures-Noninvasive:  no previous non-invasive cardiovascular testing;  Peripheral Vascular Procedures:  no previous invasive peripheral vascular procedures.;  LVEF not documented,   ____________________________ CARDIO-PULMONARY TEST DATES EKG Date:  01/16/2016;   ____________________________ FAMILY HISTORY Brother -- Brother alive with problem, Hypertension Brother -- Brother alive with problem, Stroke, Hypertension Brother -- Brother alive with problem, Diabetes mellitus, Hypertension Brother -- Bone cancer, Hypertension, Brother dead Father -- Prostate cancer, Diabetes mellitus, Hypertension, Father dead Mother -- Mother alive with problem, Hyperthyroidism, Diabetes mellitus Sister -- Hypertension, Diabetes mellitus type 2, Sister alive with problem ____________________________ SOCIAL HISTORY Alcohol Use:  occasionally;  Smoking:  nonsmoker;  Diet:  regular diet;  Lifestyle:  divorced and 2 children;  Exercise:  no regular exercise;  Occupation:  Event organiser;  Residence:  lives with male partner;   ____________________________ REVIEW OF SYSTEMS General:  weight gain of approximately 5 lbs  Integumentary:no rashes or new skin lesions. Eyes: denies diplopia, history of glaucoma or visual problems. Ears, Nose, Throat, Mouth:  denies any hearing loss, epistaxis, hoarseness or difficulty speaking. Respiratory: denies dyspnea, cough, wheezing or hemoptysis. Cardiovascular:  please  review HPI Abdominal: denies dyspepsia, GI bleeding, constipation, or diarrhea Genitourinary-Male: no dysuria, urgency, frequency, or nocturia  Musculoskeletal:  meniscal tear of left leg Neurological:  denies headaches, stroke, or TIA Psychiatric:  denies depression or anxiety Hematological/Immunologic:  denies any food allergies, bleeding disorders. ____________________________ PHYSICAL EXAMINATION VITAL SIGNS  Blood Pressure:  156/92 Sitting, Left arm, regular cuff  , 152/94 Standing, Left arm and regular cuff   Pulse:  72/min. Weight:  184.00 lbs. Height:  67"BMI: 29  Constitutional:  pleasant African Americian male in no acute distress Skin:  warm and dry to touch, no apparent skin lesions, or masses noted. Head:  normocephalic, normal hair pattern, no masses or tenderness Eyes:  EOMS Intact, PERRLA, C and S clear, Funduscopic exam not done. ENT:  ears, nose and throat reveal no gross abnormalities.  Dentition good. Neck:  supple, without massess. No JVD, thyromegaly or carotid bruits. Carotid upstroke normal. Chest:  normal symmetry, clear to auscultation. Cardiac:  regular rhythm, normal S1 and S2, No S3 or S4, no murmurs, gallops or rubs detected. Abdomen:  abdomen soft,non-tender, no masses, no hepatospenomegaly, or aneurysm noted Peripheral Pulses:  the femoral,dorsalis pedis, and posterior tibial pulses are full and equal bilaterally with no bruits auscultated. Extremities & Back:  no deformities, clubbing, cyanosis, erythema or edema observed. Normal muscle strength and tone. Neurological:  no gross motor or sensory deficits noted, affect appropriate, oriented x3. ____________________________ MOST RECENT LIPID PANEL 10/20/15  CHOL TOTL 192 mg/dl, LDL 126 NM, HDL 40 mg/dl, TRIGLYCER 130 mg/dl, ALT 19 u/l, ALK PHOS 55 u/l, CHOL/HDL 4.8 (Calc) and AST 21 u/l ____________________________ IMPRESSIONS/PLAN  1. Significantly abnormal EKG with findings suggestive of a previous  anteroseptal infarction as well as inferolateral ischemia. It is unclear whether these changes are due to hypertension or whether he has had a pre-existing myocardial infarction 2. Hypertension currently above goal in the office today 3. Prediabetes 4. Treated hyperlipidemia  Recommendations:  He is not able to exercise because of a meniscal tear. He has a marked abnormal EKG with a suggestion of a previous anteroseptal infarction. I think the most expeditious and cost-effective way to evaluate this would be with a cardiac CTA since he is unable to exercise. This would answer the question about coronary atherosclerosis in light of his multiple risk factors and also help [place] with whether or not he has had a previous myocardial infarction. This would also help for future management as he will not be able to do just a regular treadmill test because of the markedly abnormal baseline EKG.  In addition-like I would like for him to have an echocardiogram to assess his diastolic function, wall thickness in light of his abnormal EKG and hypertension. This would help in the future management. His blood pressure is above goal today and he may need to have additional blood pressure medications in the future. I asked him to monitor his blood pressure and bring the results sent to [place].  Following the above testing we will be able to give an assessment of his suitability for operation. Thank you for asking me to see him with you.  ____________________________ TODAYS ORDERS  1. 12 Lead EKG: Today  2. 2D, color flow, doppler: First Available  3. Cardiac CTA: First Available                       ____________________________ Cardiology Physician:  Kerry Hough MD [facility]

## 2016-01-22 DIAGNOSIS — R9431 Abnormal electrocardiogram [ECG] [EKG]: Secondary | ICD-10-CM | POA: Diagnosis not present

## 2016-01-23 ENCOUNTER — Encounter: Payer: Self-pay | Admitting: Cardiology

## 2016-01-26 ENCOUNTER — Ambulatory Visit: Payer: Self-pay | Admitting: Cardiovascular Disease

## 2016-01-30 ENCOUNTER — Ambulatory Visit (HOSPITAL_COMMUNITY)
Admission: RE | Admit: 2016-01-30 | Discharge: 2016-01-30 | Disposition: A | Discharge status: Home / Self Care | Payer: BLUE CROSS/BLUE SHIELD | Source: Ambulatory Visit | Attending: Cardiology | Admitting: Cardiology

## 2016-01-30 DIAGNOSIS — I700 Atherosclerosis of aorta: Secondary | ICD-10-CM | POA: Insufficient documentation

## 2016-01-30 DIAGNOSIS — R9431 Abnormal electrocardiogram [ECG] [EKG]: Secondary | ICD-10-CM | POA: Diagnosis not present

## 2016-01-30 DIAGNOSIS — R918 Other nonspecific abnormal finding of lung field: Secondary | ICD-10-CM | POA: Diagnosis not present

## 2016-01-30 DIAGNOSIS — R079 Chest pain, unspecified: Secondary | ICD-10-CM | POA: Diagnosis not present

## 2016-01-30 MED ORDER — NITROGLYCERIN 0.4 MG SL SUBL
0.4000 mg | SUBLINGUAL_TABLET | SUBLINGUAL | Status: DC | PRN
  Administered 2016-01-30: 0.4 mg via SUBLINGUAL

## 2016-01-30 MED ORDER — NITROGLYCERIN 0.4 MG SL SUBL
SUBLINGUAL_TABLET | SUBLINGUAL | Status: AC
  Filled 2016-01-30: qty 2

## 2016-01-30 MED ORDER — IOPAMIDOL (ISOVUE-370) INJECTION 76%
INTRAVENOUS | Status: AC
Start: 2016-01-30 — End: 2016-01-30
  Administered 2016-01-30: 80 mL
  Filled 2016-01-30: qty 100

## 2016-03-29 DIAGNOSIS — N5089 Other specified disorders of the male genital organs: Secondary | ICD-10-CM | POA: Diagnosis not present

## 2016-04-03 DIAGNOSIS — N448 Other noninflammatory disorders of the testis: Secondary | ICD-10-CM | POA: Diagnosis not present

## 2016-06-03 DIAGNOSIS — E782 Mixed hyperlipidemia: Secondary | ICD-10-CM | POA: Diagnosis not present

## 2016-06-03 DIAGNOSIS — E119 Type 2 diabetes mellitus without complications: Secondary | ICD-10-CM | POA: Diagnosis not present

## 2016-06-10 DIAGNOSIS — I119 Hypertensive heart disease without heart failure: Secondary | ICD-10-CM | POA: Diagnosis not present

## 2016-06-10 DIAGNOSIS — E782 Mixed hyperlipidemia: Secondary | ICD-10-CM | POA: Diagnosis not present

## 2016-06-10 DIAGNOSIS — Z23 Encounter for immunization: Secondary | ICD-10-CM | POA: Diagnosis not present

## 2016-06-10 DIAGNOSIS — E119 Type 2 diabetes mellitus without complications: Secondary | ICD-10-CM | POA: Diagnosis not present

## 2016-06-10 DIAGNOSIS — I10 Essential (primary) hypertension: Secondary | ICD-10-CM | POA: Diagnosis not present

## 2016-12-06 DIAGNOSIS — K5900 Constipation, unspecified: Secondary | ICD-10-CM | POA: Diagnosis not present

## 2016-12-06 DIAGNOSIS — E119 Type 2 diabetes mellitus without complications: Secondary | ICD-10-CM | POA: Diagnosis not present

## 2016-12-06 DIAGNOSIS — I10 Essential (primary) hypertension: Secondary | ICD-10-CM | POA: Diagnosis not present

## 2016-12-06 DIAGNOSIS — I119 Hypertensive heart disease without heart failure: Secondary | ICD-10-CM | POA: Diagnosis not present

## 2016-12-06 DIAGNOSIS — Z125 Encounter for screening for malignant neoplasm of prostate: Secondary | ICD-10-CM | POA: Diagnosis not present

## 2016-12-06 DIAGNOSIS — E782 Mixed hyperlipidemia: Secondary | ICD-10-CM | POA: Diagnosis not present

## 2017-06-09 DIAGNOSIS — K5900 Constipation, unspecified: Secondary | ICD-10-CM | POA: Diagnosis not present

## 2017-06-09 DIAGNOSIS — I10 Essential (primary) hypertension: Secondary | ICD-10-CM | POA: Diagnosis not present

## 2017-06-09 DIAGNOSIS — E782 Mixed hyperlipidemia: Secondary | ICD-10-CM | POA: Diagnosis not present

## 2017-06-09 DIAGNOSIS — E119 Type 2 diabetes mellitus without complications: Secondary | ICD-10-CM | POA: Diagnosis not present

## 2017-12-08 DIAGNOSIS — E782 Mixed hyperlipidemia: Secondary | ICD-10-CM | POA: Diagnosis not present

## 2017-12-08 DIAGNOSIS — E119 Type 2 diabetes mellitus without complications: Secondary | ICD-10-CM | POA: Diagnosis not present

## 2017-12-08 DIAGNOSIS — Z23 Encounter for immunization: Secondary | ICD-10-CM | POA: Diagnosis not present

## 2017-12-08 DIAGNOSIS — Z1159 Encounter for screening for other viral diseases: Secondary | ICD-10-CM | POA: Diagnosis not present

## 2017-12-08 DIAGNOSIS — Z125 Encounter for screening for malignant neoplasm of prostate: Secondary | ICD-10-CM | POA: Diagnosis not present

## 2017-12-08 DIAGNOSIS — Z0001 Encounter for general adult medical examination with abnormal findings: Secondary | ICD-10-CM | POA: Diagnosis not present

## 2017-12-08 DIAGNOSIS — I119 Hypertensive heart disease without heart failure: Secondary | ICD-10-CM | POA: Diagnosis not present

## 2017-12-08 DIAGNOSIS — S46001A Unspecified injury of muscle(s) and tendon(s) of the rotator cuff of right shoulder, initial encounter: Secondary | ICD-10-CM | POA: Diagnosis not present

## 2017-12-08 DIAGNOSIS — I10 Essential (primary) hypertension: Secondary | ICD-10-CM | POA: Diagnosis not present

## 2018-02-24 DIAGNOSIS — Z86010 Personal history of colonic polyps: Secondary | ICD-10-CM | POA: Diagnosis not present

## 2018-02-24 DIAGNOSIS — D126 Benign neoplasm of colon, unspecified: Secondary | ICD-10-CM | POA: Diagnosis not present

## 2018-02-27 DIAGNOSIS — D126 Benign neoplasm of colon, unspecified: Secondary | ICD-10-CM | POA: Diagnosis not present

## 2019-11-11 DIAGNOSIS — H43391 Other vitreous opacities, right eye: Secondary | ICD-10-CM | POA: Diagnosis not present

## 2019-12-17 DIAGNOSIS — Z125 Encounter for screening for malignant neoplasm of prostate: Secondary | ICD-10-CM | POA: Diagnosis not present

## 2019-12-17 DIAGNOSIS — Z0000 Encounter for general adult medical examination without abnormal findings: Secondary | ICD-10-CM | POA: Diagnosis not present

## 2019-12-17 DIAGNOSIS — I10 Essential (primary) hypertension: Secondary | ICD-10-CM | POA: Diagnosis not present

## 2019-12-17 DIAGNOSIS — E1169 Type 2 diabetes mellitus with other specified complication: Secondary | ICD-10-CM | POA: Diagnosis not present

## 2019-12-17 DIAGNOSIS — E782 Mixed hyperlipidemia: Secondary | ICD-10-CM | POA: Diagnosis not present

## 2020-06-14 DIAGNOSIS — H35033 Hypertensive retinopathy, bilateral: Secondary | ICD-10-CM | POA: Diagnosis not present

## 2020-06-14 DIAGNOSIS — E119 Type 2 diabetes mellitus without complications: Secondary | ICD-10-CM | POA: Diagnosis not present

## 2020-06-14 DIAGNOSIS — H25013 Cortical age-related cataract, bilateral: Secondary | ICD-10-CM | POA: Diagnosis not present

## 2020-06-14 DIAGNOSIS — H2513 Age-related nuclear cataract, bilateral: Secondary | ICD-10-CM | POA: Diagnosis not present

## 2021-06-28 DIAGNOSIS — E119 Type 2 diabetes mellitus without complications: Secondary | ICD-10-CM | POA: Diagnosis not present

## 2021-06-28 DIAGNOSIS — H35033 Hypertensive retinopathy, bilateral: Secondary | ICD-10-CM | POA: Diagnosis not present

## 2021-06-28 DIAGNOSIS — H25013 Cortical age-related cataract, bilateral: Secondary | ICD-10-CM | POA: Diagnosis not present

## 2021-06-28 DIAGNOSIS — H43391 Other vitreous opacities, right eye: Secondary | ICD-10-CM | POA: Diagnosis not present

## 2021-06-28 DIAGNOSIS — H2513 Age-related nuclear cataract, bilateral: Secondary | ICD-10-CM | POA: Diagnosis not present

## 2021-08-10 DIAGNOSIS — Z23 Encounter for immunization: Secondary | ICD-10-CM | POA: Diagnosis not present

## 2021-08-10 DIAGNOSIS — Z0000 Encounter for general adult medical examination without abnormal findings: Secondary | ICD-10-CM | POA: Diagnosis not present

## 2021-08-10 DIAGNOSIS — Z125 Encounter for screening for malignant neoplasm of prostate: Secondary | ICD-10-CM | POA: Diagnosis not present

## 2021-08-10 DIAGNOSIS — I10 Essential (primary) hypertension: Secondary | ICD-10-CM | POA: Diagnosis not present

## 2021-08-10 DIAGNOSIS — E1169 Type 2 diabetes mellitus with other specified complication: Secondary | ICD-10-CM | POA: Diagnosis not present

## 2021-08-10 DIAGNOSIS — E782 Mixed hyperlipidemia: Secondary | ICD-10-CM | POA: Diagnosis not present

## 2021-11-09 DIAGNOSIS — E782 Mixed hyperlipidemia: Secondary | ICD-10-CM | POA: Diagnosis not present

## 2021-11-09 DIAGNOSIS — E1169 Type 2 diabetes mellitus with other specified complication: Secondary | ICD-10-CM | POA: Diagnosis not present

## 2022-02-04 DIAGNOSIS — E1169 Type 2 diabetes mellitus with other specified complication: Secondary | ICD-10-CM | POA: Diagnosis not present

## 2022-02-11 DIAGNOSIS — E782 Mixed hyperlipidemia: Secondary | ICD-10-CM | POA: Diagnosis not present

## 2022-02-11 DIAGNOSIS — Z23 Encounter for immunization: Secondary | ICD-10-CM | POA: Diagnosis not present

## 2022-02-11 DIAGNOSIS — Z6825 Body mass index (BMI) 25.0-25.9, adult: Secondary | ICD-10-CM | POA: Diagnosis not present

## 2022-02-11 DIAGNOSIS — E1169 Type 2 diabetes mellitus with other specified complication: Secondary | ICD-10-CM | POA: Diagnosis not present

## 2022-02-11 DIAGNOSIS — I10 Essential (primary) hypertension: Secondary | ICD-10-CM | POA: Diagnosis not present

## 2022-07-04 DIAGNOSIS — E113291 Type 2 diabetes mellitus with mild nonproliferative diabetic retinopathy without macular edema, right eye: Secondary | ICD-10-CM | POA: Diagnosis not present

## 2022-07-04 DIAGNOSIS — H35033 Hypertensive retinopathy, bilateral: Secondary | ICD-10-CM | POA: Diagnosis not present

## 2022-07-04 DIAGNOSIS — H2513 Age-related nuclear cataract, bilateral: Secondary | ICD-10-CM | POA: Diagnosis not present

## 2022-07-04 DIAGNOSIS — H25013 Cortical age-related cataract, bilateral: Secondary | ICD-10-CM | POA: Diagnosis not present

## 2022-08-13 DIAGNOSIS — E1169 Type 2 diabetes mellitus with other specified complication: Secondary | ICD-10-CM | POA: Diagnosis not present

## 2022-08-13 DIAGNOSIS — E782 Mixed hyperlipidemia: Secondary | ICD-10-CM | POA: Diagnosis not present

## 2022-08-13 DIAGNOSIS — Z125 Encounter for screening for malignant neoplasm of prostate: Secondary | ICD-10-CM | POA: Diagnosis not present

## 2022-08-26 DIAGNOSIS — Z0000 Encounter for general adult medical examination without abnormal findings: Secondary | ICD-10-CM | POA: Diagnosis not present

## 2022-08-26 DIAGNOSIS — Z23 Encounter for immunization: Secondary | ICD-10-CM | POA: Diagnosis not present

## 2022-08-26 DIAGNOSIS — E782 Mixed hyperlipidemia: Secondary | ICD-10-CM | POA: Diagnosis not present

## 2022-08-26 DIAGNOSIS — I10 Essential (primary) hypertension: Secondary | ICD-10-CM | POA: Diagnosis not present

## 2022-08-26 DIAGNOSIS — E113293 Type 2 diabetes mellitus with mild nonproliferative diabetic retinopathy without macular edema, bilateral: Secondary | ICD-10-CM | POA: Diagnosis not present

## 2023-02-07 DIAGNOSIS — Z09 Encounter for follow-up examination after completed treatment for conditions other than malignant neoplasm: Secondary | ICD-10-CM | POA: Diagnosis not present

## 2023-02-07 DIAGNOSIS — Z86010 Personal history of colonic polyps: Secondary | ICD-10-CM | POA: Diagnosis not present

## 2023-02-07 DIAGNOSIS — K648 Other hemorrhoids: Secondary | ICD-10-CM | POA: Diagnosis not present

## 2023-02-28 DIAGNOSIS — R972 Elevated prostate specific antigen [PSA]: Secondary | ICD-10-CM | POA: Diagnosis not present

## 2023-02-28 DIAGNOSIS — E113293 Type 2 diabetes mellitus with mild nonproliferative diabetic retinopathy without macular edema, bilateral: Secondary | ICD-10-CM | POA: Diagnosis not present

## 2023-02-28 DIAGNOSIS — E1169 Type 2 diabetes mellitus with other specified complication: Secondary | ICD-10-CM | POA: Diagnosis not present

## 2023-03-03 DIAGNOSIS — E113293 Type 2 diabetes mellitus with mild nonproliferative diabetic retinopathy without macular edema, bilateral: Secondary | ICD-10-CM | POA: Diagnosis not present

## 2023-03-03 DIAGNOSIS — R42 Dizziness and giddiness: Secondary | ICD-10-CM | POA: Diagnosis not present

## 2023-03-03 DIAGNOSIS — E782 Mixed hyperlipidemia: Secondary | ICD-10-CM | POA: Diagnosis not present

## 2023-03-03 DIAGNOSIS — I10 Essential (primary) hypertension: Secondary | ICD-10-CM | POA: Diagnosis not present

## 2023-07-11 DIAGNOSIS — H5319 Other subjective visual disturbances: Secondary | ICD-10-CM | POA: Diagnosis not present

## 2023-07-11 DIAGNOSIS — E113291 Type 2 diabetes mellitus with mild nonproliferative diabetic retinopathy without macular edema, right eye: Secondary | ICD-10-CM | POA: Diagnosis not present

## 2023-07-11 DIAGNOSIS — H25813 Combined forms of age-related cataract, bilateral: Secondary | ICD-10-CM | POA: Diagnosis not present

## 2023-07-11 DIAGNOSIS — H35033 Hypertensive retinopathy, bilateral: Secondary | ICD-10-CM | POA: Diagnosis not present

## 2023-09-01 DIAGNOSIS — J309 Allergic rhinitis, unspecified: Secondary | ICD-10-CM | POA: Diagnosis not present

## 2023-09-01 DIAGNOSIS — I10 Essential (primary) hypertension: Secondary | ICD-10-CM | POA: Diagnosis not present

## 2023-09-01 DIAGNOSIS — E782 Mixed hyperlipidemia: Secondary | ICD-10-CM | POA: Diagnosis not present

## 2023-09-01 DIAGNOSIS — E113293 Type 2 diabetes mellitus with mild nonproliferative diabetic retinopathy without macular edema, bilateral: Secondary | ICD-10-CM | POA: Diagnosis not present

## 2023-09-01 DIAGNOSIS — Z125 Encounter for screening for malignant neoplasm of prostate: Secondary | ICD-10-CM | POA: Diagnosis not present

## 2024-02-24 DIAGNOSIS — E113293 Type 2 diabetes mellitus with mild nonproliferative diabetic retinopathy without macular edema, bilateral: Secondary | ICD-10-CM | POA: Diagnosis not present

## 2024-02-24 DIAGNOSIS — E11319 Type 2 diabetes mellitus with unspecified diabetic retinopathy without macular edema: Secondary | ICD-10-CM | POA: Diagnosis not present

## 2024-03-01 DIAGNOSIS — J309 Allergic rhinitis, unspecified: Secondary | ICD-10-CM | POA: Diagnosis not present

## 2024-03-01 DIAGNOSIS — E113293 Type 2 diabetes mellitus with mild nonproliferative diabetic retinopathy without macular edema, bilateral: Secondary | ICD-10-CM | POA: Diagnosis not present

## 2024-03-01 DIAGNOSIS — E782 Mixed hyperlipidemia: Secondary | ICD-10-CM | POA: Diagnosis not present

## 2024-03-01 DIAGNOSIS — I10 Essential (primary) hypertension: Secondary | ICD-10-CM | POA: Diagnosis not present

## 2024-05-10 ENCOUNTER — Other Ambulatory Visit: Payer: Self-pay

## 2024-05-10 ENCOUNTER — Emergency Department (HOSPITAL_COMMUNITY)
Admission: EM | Admit: 2024-05-10 | Discharge: 2024-05-10 | Disposition: A | Discharge status: Home / Self Care | Payer: Worker's Compensation | Attending: Emergency Medicine | Admitting: Emergency Medicine

## 2024-05-10 DIAGNOSIS — W208XXA Other cause of strike by thrown, projected or falling object, initial encounter: Secondary | ICD-10-CM | POA: Diagnosis not present

## 2024-05-10 DIAGNOSIS — Y990 Civilian activity done for income or pay: Secondary | ICD-10-CM | POA: Insufficient documentation

## 2024-05-10 DIAGNOSIS — S0990XA Unspecified injury of head, initial encounter: Secondary | ICD-10-CM

## 2024-05-10 DIAGNOSIS — S0101XA Laceration without foreign body of scalp, initial encounter: Secondary | ICD-10-CM | POA: Insufficient documentation

## 2024-05-10 DIAGNOSIS — Z79899 Other long term (current) drug therapy: Secondary | ICD-10-CM | POA: Insufficient documentation

## 2024-05-10 NOTE — ED Provider Notes (Signed)
 [facility] EMERGENCY DEPARTMENT AT [facility] Provider Note   CSN: [id number] Arrival date & time: 05/10/24  1703     Patient presents with: Head Injury   Shane Herman is a 69 y.o. male who presents to the emergency department with a chief complaint of head injury.  Patient states that he was trying to pick up an object on a shelf over his head whenever the object fell onto his head, patient describes the object as a 3 to 4 pound aluminum object.  Denies loss of consciousness, seizure, visual disturbances.  Denies dizziness or blurred vision.  Denies issues with balance or coordination.  Denies blood thinning medication.  Patient was seen at urgent care following incident.  Urgent care did not believe that laceration repair was needed however due to patient stating that he had a weird feeling surrounding his eyes patient was subsequently referred to the emergency department.  I evaluated the patient in triage.  Patient denies significant past medical history other than hypertension.  Patient has been ambulatory since the incident.  Denies neck pain.  Denies severe headache, light sensitivity, nausea, vomiting.  {Add pertinent medical, surgical, social history, OB history to HPI:32947}  Head Injury      Prior to Admission medications   Medication Sig Start Date End Date Taking? Authorizing Provider  cyclobenzaprine  (FLEXERIL ) 5 MG tablet 1 pill by mouth up to every 8 hours as needed. Start with one pill by mouth each bedtime as needed due to sedation 02/14/15   Levora Reyes SAUNDERS, MD  lisinopril (PRINIVIL,ZESTRIL) 20 MG tablet Take 20 mg by mouth daily.    [provider]  meloxicam  (MOBIC ) 7.5 MG tablet Take 1 tablet (7.5 mg total) by mouth daily. 02/14/15   Levora Reyes SAUNDERS, MD    Allergies: Patient has no known allergies.    Review of Systems  Skin:  Positive for wound (Small 1 inch laceration versus abrasion present to top of scalp, nonbleeding).     Updated Vital Signs BP (!) 157/89 (BP Location: Left Arm)   Pulse 60   Temp 98.1 F (36.7 C) (Oral)   Resp 16   SpO2 99%   Physical Exam Vitals and nursing note reviewed.  Constitutional:      General: He is awake. He is not in acute distress.    Appearance: Normal appearance. He is not ill-appearing, toxic-appearing or diaphoretic.  HENT:     Head: Normocephalic.     Comments: Small 1 inch laceration versus abrasion present to top of scalp, well-approximated, non-bleeding  No raccoon eyes, no Battle sign, no tenderness with palpation of facial bones Eyes:     General: No scleral icterus.    Comments: Vision grossly intact  Neck:     Comments: No cervical spine tenderness, normal range of motion of neck including looking left, right, touching chin to chest, and looking up at ceiling Pulmonary:     Effort: Pulmonary effort is normal. No respiratory distress.  Musculoskeletal:        General: Normal range of motion.     Cervical back: Normal range of motion. No tenderness.     Right lower leg: No edema.     Left lower leg: No edema.     Comments: Grossly normal range of motion of all 4 extremities, bilaterally equal grip strength, able to raise upper and lower extremities against gravity as well as resistance  Patient ambulatory without assistance  Skin:    General: Skin is warm.  Capillary Refill: Capillary refill takes less than 2 seconds.  Neurological:     General: No focal deficit present.     Mental Status: He is alert and oriented to person, place, and time.     Sensory: No sensory deficit.     Motor: No weakness.     Coordination: Coordination normal.     Gait: Gait normal.  Psychiatric:        Mood and Affect: Mood normal.        Behavior: Behavior normal. Behavior is cooperative.     (all labs ordered are listed, but only abnormal results are displayed) Labs Reviewed - No data to display  EKG: None  Radiology: No results found.  {Document  cardiac monitor, telemetry assessment procedure when appropriate:32947} Procedures   Medications Ordered in the ED - No data to display    {Click here for ABCD2, HEART and other calculators REFRESH Note before signing:1}                              Medical Decision Making  Patient presents to the ED for concern of head injury, this involves an extensive number of treatment options, and is a complaint that carries with it a high risk of complications and morbidity.  The differential diagnosis includes facial fracture, brain bleed, cervical spine fracture, etc.   Co morbidities that complicate the patient evaluation  Hypertension  Medicines ordered and prescription drug management:  I ordered medication including ***  for ***  Reevaluation of the patient after these medicines showed that the patient {resolved/improved/worsened:23923::improved} I have reviewed the patients home medicines and have made adjustments as needed   Test Considered:  ***   Critical Interventions:  ***   Consultations Obtained:  I requested consultation with the ***,  and discussed lab and imaging findings as well as pertinent plan - they recommend: ***   Problem List / ED Course:  ***   Reevaluation:  After the interventions noted above, I reevaluated the patient and found that they have :{resolved/improved/worsened:23923::improved}   Social Determinants of Health:  ***   Dispostion:  After consideration of the diagnostic results and the patients response to treatment, I feel that the patent would benefit from ***.    {Document critical care time when appropriate  Document review of labs and clinical decision tools ie [id number], etc  Document your independent review of radiology images and any outside records  Document your discussion with family members, caretakers and with consultants  Document social determinants of health affecting pt's care  Document your decision  making why or why not admission, treatments were needed:32947:::1}   Final diagnoses:  Injury of head, initial encounter  Laceration of scalp without foreign body, initial encounter    ED Discharge Orders     None

## 2024-05-10 NOTE — ED Triage Notes (Signed)
 Patient c/o head injury. Patient states a 3-4 lbs object dropped on top of his head while getting something in a shelf. Patient denies LOC. Patient denies Dizziness and blurred vision. Patient denies blood thinners.

## 2024-05-10 NOTE — Discharge Instructions (Addendum)
 It was a pleasure taking care of you today.  Based on your history and physical exam I feel you are safe for discharge.  I do not think that the laceration to your head would benefit from sutures or Steri-Strips.  I recommend that you keep it clean and covered with a Band-Aid and antibiotic ointment.  Please continue to monitor your symptoms.  If you develop any of the following symptoms including but not limited to nausea, vomiting, severe headache, visual disturbances, issues with balance or coordination, syncope, seizures, or other concerning symptom please return to the emergency department immediately for further evaluation.  Please make your primary care provider aware of your visit and findings today.  Recommend follow-up with your primary care provider within the next week for evaluation and post-visit check.  Your tetanus was already updated by urgent care.
